# Patient Record
Sex: Male | Born: 1970 | Race: White | Hispanic: No | Marital: Married | Smoking: Current every day smoker
Health system: Southern US, Community
[De-identification: ages and names within clinical notes are randomized; demographics above are authoritative.]

## PROBLEM LIST (undated history)

## (undated) DIAGNOSIS — E785 Hyperlipidemia, unspecified: Secondary | ICD-10-CM

## (undated) DIAGNOSIS — F17200 Nicotine dependence, unspecified, uncomplicated: Secondary | ICD-10-CM

## (undated) DIAGNOSIS — E559 Vitamin D deficiency, unspecified: Secondary | ICD-10-CM

---

## 2002-10-25 ENCOUNTER — Emergency Department (HOSPITAL_COMMUNITY): Admission: EM | Admit: 2002-10-25 | Discharge: 2002-10-25 | Payer: Self-pay | Admitting: Emergency Medicine

## 2002-10-25 ENCOUNTER — Encounter: Payer: Self-pay | Admitting: Emergency Medicine

## 2018-03-08 ENCOUNTER — Observation Stay (HOSPITAL_COMMUNITY)
Admission: EM | Admit: 2018-03-08 | Discharge: 2018-03-08 | Disposition: A | Discharge status: Home / Self Care | Payer: BLUE CROSS/BLUE SHIELD | Attending: Cardiology | Admitting: Cardiology

## 2018-03-08 ENCOUNTER — Other Ambulatory Visit: Payer: Self-pay

## 2018-03-08 ENCOUNTER — Encounter (HOSPITAL_COMMUNITY)
Admission: EM | Disposition: A | Discharge status: Home / Self Care | Payer: Self-pay | Source: Home / Self Care | Attending: Emergency Medicine

## 2018-03-08 ENCOUNTER — Encounter (HOSPITAL_COMMUNITY): Payer: Self-pay | Admitting: Emergency Medicine

## 2018-03-08 ENCOUNTER — Emergency Department (HOSPITAL_COMMUNITY): Payer: BLUE CROSS/BLUE SHIELD

## 2018-03-08 DIAGNOSIS — R946 Abnormal results of thyroid function studies: Secondary | ICD-10-CM | POA: Diagnosis not present

## 2018-03-08 DIAGNOSIS — R079 Chest pain, unspecified: Secondary | ICD-10-CM | POA: Diagnosis not present

## 2018-03-08 DIAGNOSIS — R0789 Other chest pain: Secondary | ICD-10-CM | POA: Insufficient documentation

## 2018-03-08 DIAGNOSIS — I2510 Atherosclerotic heart disease of native coronary artery without angina pectoris: Secondary | ICD-10-CM | POA: Diagnosis not present

## 2018-03-08 DIAGNOSIS — E7800 Pure hypercholesterolemia, unspecified: Secondary | ICD-10-CM | POA: Diagnosis not present

## 2018-03-08 DIAGNOSIS — I214 Non-ST elevation (NSTEMI) myocardial infarction: Secondary | ICD-10-CM | POA: Diagnosis not present

## 2018-03-08 DIAGNOSIS — Z8249 Family history of ischemic heart disease and other diseases of the circulatory system: Secondary | ICD-10-CM | POA: Diagnosis not present

## 2018-03-08 DIAGNOSIS — Z720 Tobacco use: Secondary | ICD-10-CM

## 2018-03-08 DIAGNOSIS — R000 Tachycardia, unspecified: Secondary | ICD-10-CM | POA: Diagnosis not present

## 2018-03-08 DIAGNOSIS — E559 Vitamin D deficiency, unspecified: Secondary | ICD-10-CM | POA: Insufficient documentation

## 2018-03-08 DIAGNOSIS — F17210 Nicotine dependence, cigarettes, uncomplicated: Secondary | ICD-10-CM | POA: Diagnosis not present

## 2018-03-08 DIAGNOSIS — E039 Hypothyroidism, unspecified: Secondary | ICD-10-CM | POA: Diagnosis present

## 2018-03-08 DIAGNOSIS — E785 Hyperlipidemia, unspecified: Secondary | ICD-10-CM | POA: Diagnosis present

## 2018-03-08 HISTORY — DX: Nicotine dependence, unspecified, uncomplicated: F17.200

## 2018-03-08 HISTORY — DX: Vitamin D deficiency, unspecified: E55.9

## 2018-03-08 HISTORY — DX: Hyperlipidemia, unspecified: E78.5

## 2018-03-08 HISTORY — PX: LEFT HEART CATH AND CORONARY ANGIOGRAPHY: CATH118249

## 2018-03-08 LAB — BASIC METABOLIC PANEL
ANION GAP: 11 (ref 5–15)
BUN: 15 mg/dL (ref 6–20)
CALCIUM: 9.8 mg/dL (ref 8.9–10.3)
CO2: 25 mmol/L (ref 22–32)
CREATININE: 1.08 mg/dL (ref 0.61–1.24)
Chloride: 103 mmol/L (ref 101–111)
Glucose, Bld: 117 mg/dL — ABNORMAL HIGH (ref 65–99)
Potassium: 3.8 mmol/L (ref 3.5–5.1)
SODIUM: 139 mmol/L (ref 135–145)

## 2018-03-08 LAB — CBC
HCT: 44.4 % (ref 39.0–52.0)
HEMOGLOBIN: 14.6 g/dL (ref 13.0–17.0)
MCH: 29.8 pg (ref 26.0–34.0)
MCHC: 32.9 g/dL (ref 30.0–36.0)
MCV: 90.6 fL (ref 78.0–100.0)
PLATELETS: 220 10*3/uL (ref 150–400)
RBC: 4.9 MIL/uL (ref 4.22–5.81)
RDW: 13.9 % (ref 11.5–15.5)
WBC: 9.7 10*3/uL (ref 4.0–10.5)

## 2018-03-08 LAB — RAPID URINE DRUG SCREEN, HOSP PERFORMED
Amphetamines: NOT DETECTED
BARBITURATES: NOT DETECTED
Benzodiazepines: NOT DETECTED
Cocaine: NOT DETECTED
Opiates: NOT DETECTED
Tetrahydrocannabinol: POSITIVE — AB

## 2018-03-08 LAB — T4, FREE: Free T4: 0.48 ng/dL — ABNORMAL LOW (ref 0.82–1.77)

## 2018-03-08 LAB — URINALYSIS, ROUTINE W REFLEX MICROSCOPIC
BILIRUBIN URINE: NEGATIVE
Bacteria, UA: NONE SEEN
Glucose, UA: NEGATIVE mg/dL
KETONES UR: NEGATIVE mg/dL
LEUKOCYTES UA: NEGATIVE
Nitrite: NEGATIVE
PH: 6 (ref 5.0–8.0)
Protein, ur: NEGATIVE mg/dL
SPECIFIC GRAVITY, URINE: 1.017 (ref 1.005–1.030)

## 2018-03-08 LAB — TSH: TSH: 13.162 u[IU]/mL — ABNORMAL HIGH (ref 0.350–4.500)

## 2018-03-08 LAB — I-STAT TROPONIN, ED
TROPONIN I, POC: 0 ng/mL (ref 0.00–0.08)
Troponin i, poc: 1.03 ng/mL (ref 0.00–0.08)

## 2018-03-08 LAB — TROPONIN I: Troponin I: 1.47 ng/mL (ref <0.03)

## 2018-03-08 SURGERY — LEFT HEART CATH AND CORONARY ANGIOGRAPHY
Anesthesia: LOCAL

## 2018-03-08 MED ORDER — OMEPRAZOLE MAGNESIUM 20 MG PO TBEC: 20.0000 mg | DELAYED_RELEASE_TABLET | Freq: Every day | ORAL | Status: DC

## 2018-03-08 MED ORDER — ACETAMINOPHEN 325 MG PO TABS: 650.0000 mg | ORAL_TABLET | ORAL | Status: DC | PRN

## 2018-03-08 MED ORDER — HEPARIN SODIUM (PORCINE) 1000 UNIT/ML IJ SOLN
INTRAMUSCULAR | Status: AC
  Filled 2018-03-08: qty 1

## 2018-03-08 MED ORDER — HEPARIN (PORCINE) IN NACL 1000-0.9 UT/500ML-% IV SOLN
INTRAVENOUS | Status: AC
  Filled 2018-03-08: qty 1000

## 2018-03-08 MED ORDER — ALPRAZOLAM 0.25 MG PO TABS: 0.2500 mg | ORAL_TABLET | Freq: Two times a day (BID) | ORAL | Status: DC | PRN

## 2018-03-08 MED ORDER — MIDAZOLAM HCL 2 MG/2ML IJ SOLN
INTRAMUSCULAR | Status: DC | PRN
  Administered 2018-03-08: 2 mg via INTRAVENOUS

## 2018-03-08 MED ORDER — SODIUM CHLORIDE 0.9% FLUSH: 3.0000 mL | INTRAVENOUS | Status: DC | PRN

## 2018-03-08 MED ORDER — SODIUM CHLORIDE 0.9 % IV SOLN: 250.0000 mL | INTRAVENOUS | Status: DC | PRN

## 2018-03-08 MED ORDER — NITROGLYCERIN 0.4 MG SL SUBL: 0.4000 mg | SUBLINGUAL_TABLET | SUBLINGUAL | Status: DC | PRN

## 2018-03-08 MED ORDER — SODIUM CHLORIDE 0.9 % IV SOLN
INTRAVENOUS | Status: DC | PRN
  Administered 2018-03-08: 84 mL/h via INTRAVENOUS

## 2018-03-08 MED ORDER — METOPROLOL TARTRATE 25 MG PO TABS: 12.5000 mg | ORAL_TABLET | Freq: Two times a day (BID) | ORAL | 3 refills | Status: DC

## 2018-03-08 MED ORDER — FENTANYL CITRATE (PF) 100 MCG/2ML IJ SOLN
INTRAMUSCULAR | Status: DC | PRN
  Administered 2018-03-08: 25 ug via INTRAVENOUS

## 2018-03-08 MED ORDER — ZOLPIDEM TARTRATE 5 MG PO TABS: 5.0000 mg | ORAL_TABLET | Freq: Every evening | ORAL | Status: DC | PRN

## 2018-03-08 MED ORDER — ATORVASTATIN CALCIUM 20 MG PO TABS: 20.0000 mg | ORAL_TABLET | Freq: Every day | ORAL | Status: DC

## 2018-03-08 MED ORDER — HEPARIN SODIUM (PORCINE) 1000 UNIT/ML IJ SOLN
INTRAMUSCULAR | Status: DC | PRN
  Administered 2018-03-08: 4000 [IU] via INTRAVENOUS

## 2018-03-08 MED ORDER — ASPIRIN 81 MG PO CHEW: 324.0000 mg | CHEWABLE_TABLET | Freq: Once | ORAL | Status: DC

## 2018-03-08 MED ORDER — HEPARIN (PORCINE) IN NACL 2-0.9 UNITS/ML
INTRAMUSCULAR | Status: DC | PRN
  Administered 2018-03-08 (×2): 500 mL

## 2018-03-08 MED ORDER — LIDOCAINE HCL (PF) 1 % IJ SOLN
INTRAMUSCULAR | Status: AC
  Filled 2018-03-08: qty 30

## 2018-03-08 MED ORDER — FENTANYL CITRATE (PF) 100 MCG/2ML IJ SOLN
INTRAMUSCULAR | Status: AC
  Filled 2018-03-08: qty 2

## 2018-03-08 MED ORDER — MIDAZOLAM HCL 2 MG/2ML IJ SOLN
INTRAMUSCULAR | Status: AC
  Filled 2018-03-08: qty 2

## 2018-03-08 MED ORDER — VERAPAMIL HCL 2.5 MG/ML IV SOLN
INTRAVENOUS | Status: AC
  Filled 2018-03-08: qty 2

## 2018-03-08 MED ORDER — SODIUM CHLORIDE 0.9 % WEIGHT BASED INFUSION: 1.0000 mL/kg/h | INTRAVENOUS | Status: DC

## 2018-03-08 MED ORDER — IOHEXOL 350 MG/ML SOLN
INTRAVENOUS | Status: DC | PRN
  Administered 2018-03-08: 75 mL via INTRA_ARTERIAL

## 2018-03-08 MED ORDER — VERAPAMIL HCL 2.5 MG/ML IV SOLN
INTRAVENOUS | Status: DC | PRN
  Administered 2018-03-08: 10 mL via INTRA_ARTERIAL

## 2018-03-08 MED ORDER — ACETAMINOPHEN 325 MG PO TABS: 650.0000 mg | ORAL_TABLET | Freq: Four times a day (QID) | ORAL | Status: AC | PRN

## 2018-03-08 MED ORDER — METOPROLOL TARTRATE 25 MG PO TABS: 12.5000 mg | ORAL_TABLET | Freq: Two times a day (BID) | ORAL | Status: DC

## 2018-03-08 MED ORDER — ONDANSETRON HCL 4 MG/2ML IJ SOLN: 4.0000 mg | Freq: Four times a day (QID) | INTRAMUSCULAR | Status: DC | PRN

## 2018-03-08 MED ORDER — SODIUM CHLORIDE 0.9% FLUSH: 3.0000 mL | Freq: Two times a day (BID) | INTRAVENOUS | Status: DC

## 2018-03-08 MED ORDER — LIDOCAINE HCL (PF) 1 % IJ SOLN
INTRAMUSCULAR | Status: DC | PRN
  Administered 2018-03-08: 2 mL via SUBCUTANEOUS

## 2018-03-08 SURGICAL SUPPLY — 13 items
CATH INFINITI 5 FR JL3.5 (CATHETERS) ×2 IMPLANT
CATH INFINITI 5FR ANG PIGTAIL (CATHETERS) ×2 IMPLANT
CATH INFINITI JR4 5F (CATHETERS) ×2 IMPLANT
DEVICE RAD COMP TR BAND LRG (VASCULAR PRODUCTS) ×2 IMPLANT
GUIDEWIRE INQWIRE 1.5J.035X260 (WIRE) ×1 IMPLANT
INQWIRE 1.5J .035X260CM (WIRE) ×2
KIT HEART LEFT (KITS) ×2 IMPLANT
NEEDLE PERC ENTRY 21G 2.5CM (NEEDLE) ×2 IMPLANT
PACK CARDIAC CATHETERIZATION (CUSTOM PROCEDURE TRAY) ×2 IMPLANT
SHEATH RAIN RADIAL 21G 6FR (SHEATH) ×2 IMPLANT
SYR MEDRAD MARK V 150ML (SYRINGE) ×2 IMPLANT
TRANSDUCER W/STOPCOCK (MISCELLANEOUS) ×2 IMPLANT
TUBING CIL FLEX 10 FLL-RA (TUBING) ×2 IMPLANT

## 2018-03-08 NOTE — ED Triage Notes (Signed)
Pt in from home via [facility] with c/o central NR chest pressure this am. States he was sitting when pain started, also c/o sob. Denies n/v or dizziness. Pt took "1000mg  ASA" PTA and 2 NTG. Denies pain on arrival. ST 106, 18G LAC

## 2018-03-08 NOTE — Discharge Summary (Signed)
Discharge Summary    Patient ID: Edward Oliver,  MRN: [id number], DOB/AGE: 49-Apr-1972 49 y.o.  y.o.  Admit date: 03/08/2018 Discharge date: 03/08/2018  Primary Care Provider: Rehabilitation, [facility] & Primary Cardiologist: Dr Edward Oliver  Discharge Diagnoses    Principal Problem:   Chest pain with moderate risk of acute coronary syndrome Active Problems:   Tachycardia   Dyslipidemia   Tobacco abuse   Family history of coronary artery disease in father   Allergies No Known Allergies  Diagnostic Studies/Procedures    Cath 03/08/18 _____________   History of Present Illness     Chest pain  Hospital Course     Mr. Edward Oliver has treated HLD. He smokes a pack a day. His father had CABG in his 49. The pt was sitting at his desk today when he became "sweaty" and had SSCP, pressure. EMS was contacted and reported the patient had tachycardia "190". Unfortunately no strips are available. The pt says he was feeling better by the time he was being placed in the ambulance. He admits to a similar episode while walking in his yard a week ago. That episode was brief, "30 seconds".  His EKG shows NSR, his Troponin #2 is positive -1.0. He was taken to the cath lab where cath showed minor CAD and normal LVF. He will be discharged later today on low dose beta blocker and instructions to avoid caffeine. He also need to f/u his TSH with his PCP as he appears mildly hypothyroid.    Discharge Vitals Blood pressure 126/85, pulse (!) 0, temperature 98.4 F (36.9 C), resp. rate (!) 0, height 5\' 10"  (1.778 Oliver), weight 184 lb (83.5 kg), SpO2 (!) 0 %.  Filed Weights   03/08/18 1148  Weight: 184 lb (83.5 kg)    Labs & Radiologic Studies    CBC Recent Labs    03/08/18 0808  WBC 9.7  HGB 14.6  HCT 44.4  MCV 90.6  PLT 220   Basic Metabolic Panel Recent Labs    40/98/1104/26/19 0808  NA 139  K 3.8  CL 103  CO2 25  GLUCOSE 117*  BUN 15  CREATININE 1.08  CALCIUM 9.8    Liver Function Tests No results for input(s): AST, ALT, ALKPHOS, BILITOT, PROT, ALBUMIN in the last 72 hours. No results for input(s): LIPASE, AMYLASE in the last 72 hours. Cardiac Enzymes Recent Labs    03/08/18 1442  TROPONINI 1.47*   BNP Invalid input(s): POCBNP D-Dimer No results for input(s): DDIMER in the last 72 hours. Hemoglobin A1C No results for input(s): HGBA1C in the last 72 hours. Fasting Lipid Panel No results for input(s): CHOL, HDL, LDLCALC, TRIG, CHOLHDL, LDLDIRECT in the last 72 hours. Thyroid Function Tests Recent Labs    03/08/18 0948  TSH 13.162*   _____________  Dg Chest 2 View  Result Date: 03/08/2018 CLINICAL DATA:  Chest pain and dizziness this morning EXAM: CHEST - 2 VIEW COMPARISON:  07/07/2016 FINDINGS: Upper normal heart size. Mediastinal contours and pulmonary vascularity normal. Lungs clear. No pleural effusion or pneumothorax. Bones unremarkable. IMPRESSION: No acute abnormalities. Electronically Signed   By: Edward Oliver  Edward Oliver Oliver.D.   On: 03/08/2018 08:54   Disposition   Pt is being discharged home today in good condition.  Follow-up Plans & Appointments    Follow-up Information    Edward Oliver, Edward M, MD Follow up.   Specialty:  Cardiology Why:  office will contact to you to see Dr Edward Oliver's APP in 1-2 weeks  Contact information: [address] [place] [address] [phone]        Rehabilitation, [facility] & Follow up.   Specialty:  Physical Medicine and Rehabilitation Why:  Call PCP's office Monday to follow up on thyroid tests           Discharge Medications   Allergies as of 03/08/2018   No Known Allergies     Medication List    TAKE these medications   acetaminophen 325 MG tablet Commonly known as:  TYLENOL Take 2 tablets (650 mg total) by mouth every 6 (six) hours as needed for mild pain or headache.   atorvastatin 20 MG tablet Commonly known as:  LIPITOR Take 20 mg by  mouth daily.   metoprolol tartrate 25 MG tablet Commonly known as:  LOPRESSOR Take 0.5 tablets (12.5 mg total) by mouth 2 (two) times daily.   PRILOSEC OTC 20 MG tablet Generic drug:  omeprazole Take 20 mg by mouth daily.   vitamin C 1000 MG tablet Take 500 mg by mouth daily.   Vitamin D3 2000 units capsule Take 2,000 Units by mouth daily.           Outstanding Labs/Studies     Duration of Discharge Encounter   Greater than 30 minutes including physician time.  Signed, [address] [place] 03/08/2018, 4:51 PM

## 2018-03-08 NOTE — Interval H&P Note (Signed)
History and Physical Interval Note:  03/08/2018 4:05 PM  Edward Oliver  has presented today for surgery, with the diagnosis of cp nstemi  The various methods of treatment have been discussed with the patient and family. After consideration of risks, benefits and other options for treatment, the patient has consented to  Procedure(s): LEFT HEART CATH AND CORONARY ANGIOGRAPHY (N/A) as a surgical intervention .  The patient's history has been reviewed, patient examined, no change in status, stable for surgery.  I have reviewed the patient's chart and labs.  Questions were answered to the patient's satisfaction.   Cath Lab Visit (complete for each Cath Lab visit)  Clinical Evaluation Leading to the Procedure:   ACS: Yes.    Non-ACS:    Anginal Classification: CCS III  Anti-ischemic medical therapy: No Therapy  Non-Invasive Test Results: No non-invasive testing performed  Prior CABG: No previous CABG        Theron Aristaeter [facility] CampusJordanMD,FACC 03/08/2018 4:05 PM]

## 2018-03-08 NOTE — Discharge Instructions (Signed)
Supraventricular Tachycardia, Adult °Supraventricular tachycardia (SVT) is a kind of abnormal heartbeat. It makes your heart beat very fast and then beat at a normal speed. °A normal heart beats 60-100 times a minute. This condition can make your heart beat more than 150 times a minute. Times of having a fast heartbeat (episodes) can be scary, but they are usually not dangerous. They can lead to problems if: °· They happen often. °· They last a long time. °Symptoms of this condition include: °· A pounding heart. °· A feeling that your heart is skipping beats (palpitations). °· Weakness. °· Trouble getting enough air (shortness of breath). °· Pain or tightness in your chest. °· Feeling like you are going to pass out (light-headedness). °· Feeling worried or nervous (anxiety). °· Dizziness. °· Sweating. °· Feeling sick to your stomach (nausea). °· Passing out (fainting). °· Tiredness. °Sometimes, there are no symptoms. °Follow these instructions at home: °Stress  °· Avoid things that make you feel stressed. °· Find out what helps you feel less stressed. Try: °¨ Doing a relaxing activity, like yoga, meditation, or being out in nature. °¨ Listening to relaxing music. °¨ Doing relaxation techniques, like deep breathing. °¨ Taking steps to be healthy. These include getting lots of sleep, exercising, and eating a balanced diet. °¨ Talking with a mental health doctor. °Sleep  °· Try to get at least 7 hours of sleep each night. °Tobacco and nicotine  °· Do not use anything that has nicotine or tobacco, such as cigarettes and e-cigarettes. If you need help quitting, ask your doctor. °Alcohol  °· If alcohol gives you a fast heartbeat, do not drink alcohol. °· If alcohol does not seem to give you a fast heartbeat, limit your alcohol. For nonpregnant women, this means no more than 1 drink a day. For men, this means no more than 2 drinks a day. "One drink" means one of these: °¨ 12 oz of beer. °¨ 5 oz of wine. °¨ 1½ oz of hard  liquor. °Caffeine  °· If caffeine gives you a fast heartbeat, do not eat, drink, or use anything with caffeine in it. °· If caffeine does not seem to give you a fast heartbeat, limit how much caffeine you eat, drink, or use. °Stimulant drugs  °· Do not use stimulant drugs. These are drugs like cocaine or methamphetamine. If you need help quitting, ask your doctor. °General instructions  °· Stay at a healthy weight. °· Exercise regularly. Ask your doctor to suggest some good activities for you. Try one of these options: °¨ 150 minutes a week of gentle exercise, like walking or yoga. °¨ 75 minutes a week of exercise that is very active, like running or swimming. °¨ A combination of gentle exercise and very active exercise. °· Do home treatments to slow down your heartbeat as told by your doctor. °· Take over-the-counter and prescription medicines only as told by your doctor. °Contact a doctor if: °· You have a fast heartbeat more often. °· Times of having a fast heartbeat last longer than before. °· Your home treatments to slow down your heartbeat do not help. °· You have new symptoms. °Get help right away if: °· You have chest pain. °· Your symptoms get worse. °· You have trouble breathing. °· Your heart beats very fast for more than 20 minutes. °· You pass out (faint). °These symptoms may be an emergency. Do not wait to see if the symptoms will go away. Get medical help right away. Call your   [place] Do not drive yourself to the hospital. This information is not intended to replace advice given to you by your health care provider. Make sure you discuss any questions you have with your health care provider. Document Released: 10/30/2005 Document Revised: 07/06/2016 Document Reviewed: 07/06/2016 Elsevier Interactive Patient Education  2017 Elsevier Inc[employer]Radial Site Care Refer to this sheet in the next few weeks. These instructions provide you with information about caring for  yourself after your procedure. Your health care provider may also give you more specific instructions. Your treatment has been planned according to current medical practices, but problems sometimes occur. Call your health care provider if you have any problems or questions after your procedure. What can I expect after the procedure? After your procedure, it is typical to have the following:  Bruising at the radial site that usually fades within 1-2 weeks.  Blood collecting in the tissue (hematoma) that may be painful to the touch. It should usually decrease in size and tenderness within 1-2 weeks.  Follow these instructions at home:  Take medicines only as directed by your health care provider.  You may shower 24-48 hours after the procedure or as directed by your health care provider. Remove the bandage (dressing) and gently wash the site with plain soap and water. Pat the area dry with a clean towel. Do not rub the site, because this may cause bleeding.  Do not take baths, swim, or use a hot tub until your health care provider approves.  Check your insertion site every day for redness, swelling, or drainage.  Do not apply powder or lotion to the site.  Do not flex or bend the affected arm for 24 hours or as directed by your health care provider.  Do not push or pull heavy objects with the affected arm for 24 hours or as directed by your health care provider.  Do not lift over 10 lb (4.5 kg) for 5 days after your procedure or as directed by your health care provider.  Ask your health care provider when it is okay to: ? Return to work or school. ? Resume usual physical activities or sports. ? Resume sexual activity.  Do not drive home if you are discharged the same day as the procedure. Have someone else drive you.  You may drive 24 hours after the procedure unless otherwise instructed by your health care provider.  Do not operate machinery or power tools for 24 hours after the  procedure.  If your procedure was done as an outpatient procedure, which means that you went home the same day as your procedure, a responsible adult should be with you for the first 24 hours after you arrive home.  Keep all follow-up visits as directed by your health care provider. This is important. Contact a health care provider if:  You have a fever.  You have chills.  You have increased bleeding from the radial site. Hold pressure on the site. Get help right away if:  You have unusual pain at the radial site.  You have redness, warmth, or swelling at the radial site.  You have drainage (other than a small amount of blood on the dressing) from the radial site.  The radial site is bleeding, and the bleeding does not stop after 30 minutes of holding steady pressure on the site.  Your arm or hand becomes pale, cool, tingly, or numb. This information is not intended to replace advice given to you by your health care provider. Make  sure you discuss any questions you have with your health care provider. Document Released: 12/02/2010 Document Revised: 04/06/2016 Document Reviewed: 05/18/2014 Elsevier Interactive Patient Education  2018 [employer]

## 2018-03-08 NOTE — H&P (Signed)
Cardiology Admission History and Physical:   Patient ID: Edward Oliver; MRN: [id number]; DOB: 1971-08-24   Admission date: 03/08/2018  Primary Care Provider: Rehabilitation, [facility] Physical [facility] & Primary Cardiologist: [place]  Chief Complaint:  Chest pain  Patient Profile:   Edward Oliver is a 47 y.o. male with a history of HLD, smoking, and a family history of early CAD, presented to the ED via EMS with complaints of chest pain.  History of Present Illness:   Edward Oliver has treated HLD. He smokes a pack a day. His father had CABG in his 14's. The pt was sitting at his desk today when he became "sweaty" and had SSCP, pressure. EMS was contacted and reported the patient had tachycardia "190". Unfortunately no strips are available. The pt says he was feeling better by the time he was being placed in the ambulance. He admits to a similar episode while walking in his yard a week ago. That episode was brief, "30 seconds".  His EKG shows NSR, his Troponin #2 is positive -1.0.   Past Medical History:  Diagnosis Date  . Hyperlipemia   . Smoker   . Vitamin D deficiency     Medications Prior to Admission: Prior to Admission medications   Medication Sig Start Date End Date Taking? Authorizing Provider  Ascorbic Acid (VITAMIN C) 1000 MG tablet Take 500 mg by mouth daily.   Yes [provider]  atorvastatin (LIPITOR) 20 MG tablet Take 20 mg by mouth daily. 05/17/17 05/17/18 Yes [provider]  Cholecalciferol (VITAMIN D3) 2000 units capsule Take 2,000 Units by mouth daily.   Yes [provider]  omeprazole (PRILOSEC OTC) 20 MG tablet Take 20 mg by mouth daily.   Yes [provider]     Allergies:   No Known Allergies  Social History:   Social History   Socioeconomic History  . Marital status: Married    Spouse name: Not on file  . Number of children: Not on file  . Years of education: Not on file  . Highest education  level: Not on file  Occupational History  . Not on file  Social Needs  . Financial resource strain: Not on file  . Food insecurity:    Worry: Not on file    Inability: Not on file  . Transportation needs:    Medical: Not on file    Non-medical: Not on file  Tobacco Use  . Smoking status: Current Every Day Smoker    Packs/day: 1.00    Types: Cigarettes  Substance and Sexual Activity  . Alcohol use: Not on file  . Drug use: Not on file  . Sexual activity: Not on file  Lifestyle  . Physical activity:    Days per week: Not on file    Minutes per session: Not on file  . Stress: Not on file  Relationships  . Social connections:    Talks on phone: Not on file    Gets together: Not on file    Attends religious service: Not on file    Active member of club or organization: Not on file    Attends meetings of clubs or organizations: Not on file    Relationship status: Not on file  . Intimate partner violence:    Fear of current or ex partner: Not on file    Emotionally abused: Not on file    Physically abused: Not on file    Forced sexual activity: Not on  file  Other Topics Concern  . Not on file  Social History Narrative  . Not on file    Family History:   The patient's family history includes CAD in his father.    ROS:  Please see the history of present illness.  All other ROS reviewed and negative.     Physical Exam/Data:   Vitals:   03/08/18 1300 03/08/18 1315 03/08/18 1330 03/08/18 1345  BP: 117/86 123/84 127/84 (!) 129/92  Pulse: (!) 59 (!) 59 63 64  Resp: 18 (!) 21 15 18   Temp:      SpO2: 95% 95% 95% 94%  Weight:      Height:       No intake or output data in the 24 hours ending 03/08/18 1359 Filed Weights   03/08/18 1148  Weight: 184 lb (83.5 kg)   Body mass index is 26.4 kg/m.  General:  Well nourished, well developed, in no acute distress HEENT: normal Lymph: no adenopathy Neck: no JVD, no bruit Endocrine:  No thryomegaly Vascular: No carotid  bruits; FA pulses 2+ bilaterally without bruits  Cardiac:  normal S1, S2; RRR; no murmur  Lungs:  clear to auscultation bilaterally, no wheezing, rhonchi or rales  Abd: soft, nontender, no hepatomegaly  Ext: no edema, distal pulses 3+ Musculoskeletal:  No deformities, BUE and BLE strength normal and equal Skin: warm and dry  Neuro:  CNs 2-12 intact, no focal abnormalities noted Psych:  Normal affect    EKG:  The ECG that was done and was personally reviewed and demonstrates NSR  Laboratory Data:  Chemistry Recent Labs  Lab 03/08/18 0808  NA 139  K 3.8  CL 103  CO2 25  GLUCOSE 117*  BUN 15  CREATININE 1.08  CALCIUM 9.8  GFRNONAA >60  GFRAA >60  ANIONGAP 11    No results for input(s): PROT, ALBUMIN, AST, ALT, ALKPHOS, BILITOT in the last 168 hours. Hematology Recent Labs  Lab 03/08/18 0808  WBC 9.7  RBC 4.90  HGB 14.6  HCT 44.4  MCV 90.6  MCH 29.8  MCHC 32.9  RDW 13.9  PLT 220   Cardiac EnzymesNo results for input(s): TROPONINI in the last 168 hours.  Recent Labs  Lab 03/08/18 0816 03/08/18 1153  TROPIPOC 0.00 1.03*    BNPNo results for input(s): BNP, PROBNP in the last 168 hours.  DDimer No results for input(s): DDIMER in the last 168 hours.  Radiology/Studies:  Dg Chest 2 View  Result Date: 03/08/2018 CLINICAL DATA:  Chest pain and dizziness this morning EXAM: CHEST - 2 VIEW COMPARISON:  07/07/2016 FINDINGS: Upper normal heart size. Mediastinal contours and pulmonary vascularity normal. Lungs clear. No pleural effusion or pneumothorax. Bones unremarkable. IMPRESSION: No acute abnormalities. Electronically Signed   By: Ulyses Southward M.D.   On: 03/08/2018 08:54    Assessment and Plan:   Chest pain- moderate risk of acute coronary syndrome, possible NSTEMI  PSVT by history- no documentation  Dyslipidemia he had an LDL of > 200 at one point  Smoker- 1PPD  FM Hx of CAD- F-CABG in his 40's  Elevated TSH- 13.1- no history of hypothyroidism and no prior  TSH levels found ( this could explain his dyslipidemia).   Plan: Pt seen by Dr [place] and myself, plan diagnostic cath today. The patient understands that risks included but are not limited to stroke (1 in 1000), death (1 in 1000), kidney failure [usually temporary] (1 in 500), bleeding (1 in 200), allergic reaction [possibly serious] (  1 in 200).  The patient understands and agrees to proceed.  Add ASA and low dose beta blocker. Check repeat TSH with free T4.   Severity of Illness: The appropriate patient status for this patient is OBSERVATION. Observation status is judged to be reasonable and necessary in order to provide the required intensity of service to ensure the patient's safety. The patient's presenting symptoms, physical exam findings, and initial radiographic and laboratory data in the context of their medical condition is felt to place them at decreased risk for further clinical deterioration. Furthermore, it is anticipated that the patient will be medically stable for discharge from the hospital within 2 midnights of admission. The following factors support the patient status of observation.   " The patient's presenting symptoms include chest pain. " The physical exam findings include normal. " The initial radiographic and laboratory data are elevated Troponin.     For questions or updates, please contact [facility] Please consult [email or web address] for contact info under Cardiology/STEMI.    Jolene ProvostSigned, Melanny Wire, PA-C  03/08/2018 1:59 PM

## 2018-03-08 NOTE — ED Provider Notes (Signed)
[facility] EMERGENCY DEPARTMENT Provider Note   CSN: [id number] Arrival date & time: 03/08/18  0801     History   Chief Complaint Chief Complaint  Patient presents with  . Chest Pain    HPI Edward Oliver is a 47 y.o. male.  Pt presents to the ED today with cp and sob.  Pt said he had just gotten to work when he felt like something was squeezing his chest.  He felt sob and felt clammy.   The pt said he measured his HR and it was 190.  He said when EMS arrived, it was down to 170.  No strips from EMS with this extreme tachycardia.  The pt said this happened to him last week for about 30 seconds.  He denies any otc meds.  He said he drinks 1 cup of coffee per day then drinks 5 mountain dews per day.  He also smokes.  He took 1000 mg ASA then EMS gave him 2 nitro.  He has no cp now and feels normal.       Past Medical History:  Diagnosis Date  . Hyperlipemia   . Smoker   . Vitamin D deficiency     Patient Active Problem List   Diagnosis Date Noted  . Chest pain with moderate risk of acute coronary syndrome 03/08/2018  . Tachycardia 03/08/2018  . Dyslipidemia 03/08/2018  . Smoker 03/08/2018  . Family history of coronary artery disease in father 03/08/2018        Home Medications    Prior to Admission medications   Medication Sig Start Date End Date Taking? Authorizing Provider  Ascorbic Acid (VITAMIN C) 1000 MG tablet Take 500 mg by mouth daily.   Yes [provider]  atorvastatin (LIPITOR) 20 MG tablet Take 20 mg by mouth daily. 05/17/17 05/17/18 Yes [provider]  Cholecalciferol (VITAMIN D3) 2000 units capsule Take 2,000 Units by mouth daily.   Yes [provider]  omeprazole (PRILOSEC OTC) 20 MG tablet Take 20 mg by mouth daily.   Yes [provider]    Family History Family History  Problem Relation Age of Onset  . CAD Father        CABG in 80's    Social History Social History   Tobacco Use  .  Smoking status: Current Every Day Smoker    Packs/day: 1.00    Types: Cigarettes  Substance Use Topics  . Alcohol use: Not on file  . Drug use: Not on file     Allergies   Patient has no known allergies.   Review of Systems Review of Systems  Respiratory: Positive for shortness of breath.   Cardiovascular: Positive for chest pain and palpitations.  All other systems reviewed and are negative.    Physical Exam Updated Vital Signs BP 126/80   Pulse 66   Temp 98.4 F (36.9 C)   Resp 11   Ht 5\' 10"  (1.778 m)   Wt 83.5 kg (184 lb)   SpO2 97%   BMI 26.40 kg/m   Physical Exam  Constitutional: He is oriented to person, place, and time. He appears well-developed and well-nourished.  HENT:  Head: Normocephalic and atraumatic.  Eyes: Pupils are equal, round, and reactive to light. EOM are normal.  Neck: Normal range of motion. Neck supple.  Cardiovascular: Normal rate, regular rhythm, intact distal pulses and normal pulses.  Pulmonary/Chest: Effort normal and breath sounds normal.  Abdominal: Soft. Bowel sounds are normal.  Musculoskeletal: Normal range of motion.       Right lower leg: Normal.       Left lower leg: Normal.  Neurological: He is alert and oriented to person, place, and time.  Skin: Skin is warm. Capillary refill takes less than 2 seconds.  Psychiatric: He has a normal mood and affect. His behavior is normal.  Nursing note and vitals reviewed.    ED Treatments / Results  Labs (all labs ordered are listed, but only abnormal results are displayed) Labs Reviewed  BASIC METABOLIC PANEL - Abnormal; Notable for the following components:      Result Value   Glucose, Bld 117 (*)    All other components within normal limits  RAPID URINE DRUG SCREEN, HOSP PERFORMED - Abnormal; Notable for the following components:   Tetrahydrocannabinol POSITIVE (*)    All other components within normal limits  URINALYSIS, ROUTINE W REFLEX MICROSCOPIC - Abnormal; Notable for  the following components:   Hgb urine dipstick SMALL (*)    All other components within normal limits  TSH - Abnormal; Notable for the following components:   TSH 13.162 (*)    All other components within normal limits  I-STAT TROPONIN, ED - Abnormal; Notable for the following components:   Troponin i, poc 1.03 (*)    All other components within normal limits  CBC  T4, FREE  TROPONIN I  TROPONIN I  TROPONIN I  I-STAT TROPONIN, ED    EKG EKG Interpretation  Date/Time:  Friday March 08 2018 08:05:13 EDT Ventricular Rate:  79 PR Interval:    QRS Duration: 98 QT Interval:  384 QTC Calculation: 441 R Axis:   84 Text Interpretation:  Sinus rhythm Baseline wander in lead(s) III No old tracing to compare Confirmed by Jacalyn LefevreHaviland, Hanny Elsberry [phone]) on 03/08/2018 8:40:53 AM   Radiology Dg Chest 2 View  Result Date: 03/08/2018 CLINICAL DATA:  Chest pain and dizziness this morning EXAM: CHEST - 2 VIEW COMPARISON:  07/07/2016 FINDINGS: Upper normal heart size. Mediastinal contours and pulmonary vascularity normal. Lungs clear. No pleural effusion or pneumothorax. Bones unremarkable. IMPRESSION: No acute abnormalities. Electronically Signed   By: Ulyses SouthwardMark  Boles M.D.   On: 03/08/2018 08:54    Procedures Procedures (including critical care time)  Medications Ordered in ED Medications  aspirin chewable tablet 324 mg (has no administration in time range)  metoprolol tartrate (LOPRESSOR) tablet 12.5 mg (has no administration in time range)  acetaminophen (TYLENOL) tablet 650 mg (has no administration in time range)  ondansetron (ZOFRAN) injection 4 mg (has no administration in time range)  zolpidem (AMBIEN) tablet 5 mg (has no administration in time range)  ALPRAZolam (XANAX) tablet 0.25 mg (has no administration in time range)  nitroGLYCERIN (NITROSTAT) SL tablet 0.4 mg (has no administration in time range)  atorvastatin (LIPITOR) tablet 20 mg (has no administration in time range)  omeprazole  (PRILOSEC OTC) EC tablet 20 mg (has no administration in time range)     Initial Impression / Assessment and Plan / ED Course  I have reviewed the triage vital signs and the nursing notes.  Pertinent labs & imaging results that were available during my care of the patient were reviewed by me and considered in my medical decision making (see chart for details).     Pt's 1st troponin was negative, but 3 hr troponin was positive.  Pt d/w cardiology who will see pt.  No cp now.  Pt d/w cardiology who will admit for observation and plan a  diagnostic cath.  Final Clinical Impressions(s) / ED Diagnoses   Final diagnoses:  NSTEMI (non-ST elevated myocardial infarction) (HCC)  Hypothyroidism, unspecified type  Tobacco abuse    ED Discharge Orders    None       Jacalyn Lefevre, MD 03/08/18 1410

## 2018-03-09 LAB — HIV ANTIBODY (ROUTINE TESTING W REFLEX): HIV Screen 4th Generation wRfx: NONREACTIVE

## 2018-03-11 ENCOUNTER — Telehealth: Payer: Self-pay | Admitting: *Deleted

## 2018-03-11 ENCOUNTER — Encounter (HOSPITAL_COMMUNITY): Payer: Self-pay | Admitting: Cardiology

## 2018-03-11 MED FILL — Heparin Sod (Porcine)-NaCl IV Soln 1000 Unit/500ML-0.9%: INTRAVENOUS | Qty: 1000 | Status: AC

## 2018-03-11 NOTE — Telephone Encounter (Signed)
Left message for patient to call and schedule 2 week post hospital visit with Dr. Swaziland or [name], [place]

## 2018-03-27 ENCOUNTER — Encounter: Payer: Self-pay | Admitting: Cardiology

## 2018-03-27 ENCOUNTER — Ambulatory Visit: Payer: BLUE CROSS/BLUE SHIELD | Admitting: Cardiology

## 2018-03-27 DIAGNOSIS — I2510 Atherosclerotic heart disease of native coronary artery without angina pectoris: Secondary | ICD-10-CM | POA: Diagnosis not present

## 2018-03-27 DIAGNOSIS — R000 Tachycardia, unspecified: Secondary | ICD-10-CM

## 2018-03-27 DIAGNOSIS — Z720 Tobacco use: Secondary | ICD-10-CM

## 2018-03-27 DIAGNOSIS — E785 Hyperlipidemia, unspecified: Secondary | ICD-10-CM | POA: Diagnosis not present

## 2018-03-27 DIAGNOSIS — R7989 Other specified abnormal findings of blood chemistry: Secondary | ICD-10-CM

## 2018-03-27 DIAGNOSIS — Z8249 Family history of ischemic heart disease and other diseases of the circulatory system: Secondary | ICD-10-CM

## 2018-03-27 NOTE — Assessment & Plan Note (Signed)
TSH was 13- he knows to contact his PCP

## 2018-03-27 NOTE — Assessment & Plan Note (Signed)
Minor CAD at cath April 2019- 20-25% LAD 

## 2018-03-27 NOTE — Patient Instructions (Signed)
Medication Instructions:  Continue with current medications   Follow-Up: Your physician wants you to follow-up in: 6 months with Dr. Swaziland. You will receive a reminder letter in the mail two months in advance. If you don't receive a letter, please call our office to schedule the follow-up appointment.   Any Other Special Instructions Will Be Listed Below (If Applicable).     If you need a refill on your cardiac medications before your next appointment, please call your pharmacy.

## 2018-03-27 NOTE — Assessment & Plan Note (Signed)
CABG in late 40's 

## 2018-03-27 NOTE — Assessment & Plan Note (Signed)
1 PPD 

## 2018-03-27 NOTE — Progress Notes (Signed)
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 The patient smokes a pack a day. His father had CABG in his 2's. The pt was sitting at his desk 03/08/18 when he became "sweaty" and had SSCP described as "pressure". EMS was contacted and reported the patient had tachycardia "190". Unfortunately no strips are available. His EKG showed NSR, his Troponin was positive -and peaked at 1.47. He was taken to the cath lab where cath showed minor CAD and normal LVF. He was discharged on low dose beta blocker and instructions to avoid caffeine. He also needs to f/u his TSH with his PCP as he appears mildly hypothyroid with a TSH of  13.1.   Since discharge he has had one episode of tachycardia that lasted on 15 seconds or less. He is tolerating the low dose Lopressor. His main complaint is stress related to work.     Current Outpatient Medications  Medication Sig Dispense Refill  . acetaminophen (TYLENOL) 325 MG tablet Take 2 tablets (650 mg total) by mouth every 6 (six) hours as needed for mild pain or headache.    . Ascorbic Acid (VITAMIN C) 1000 MG tablet Take 500 mg by mouth every other day.     Marland Kitchen atorvastatin (LIPITOR) 20 MG tablet Take 20 mg by mouth daily.    . Cholecalciferol (VITAMIN D3) 2000 units capsule Take 2,000 Units by mouth daily.    . metoprolol tartrate (LOPRESSOR) 25 MG tablet Take 0.5 tablets (12.5 mg total) by mouth 2 (two) times daily. 90 tablet 3  . Multiple Vitamins-Minerals (ECHINACEA ACZ PO) Take 1 tablet by mouth every other day.    Marland Kitchen omeprazole (PRILOSEC OTC) 20 MG tablet Take 20 mg by mouth daily.      No current facility-administered medications for this visit.     No Known Allergies  Past Medical History:  Diagnosis Date  . Hyperlipemia   . Smoker   . Vitamin D deficiency     Social History   Socioeconomic History  . Marital status: Married    Spouse name: Not on file  . Number of children: Not on file  . Years of education: Not on file  . Highest education level: Not on file  Occupational History  . Not on file  Social Needs  . Financial resource strain: Not on file  . Food insecurity:    Worry: Not on file    Inability: Not on file  . Transportation needs:    Medical: Not on file    Non-medical: Not on file  Tobacco Use  . Smoking status: Current Every Day Smoker    Packs/day: 1.00    Types: Cigarettes  Substance and Sexual Activity  . Alcohol use: Not on file  . Drug use: Not on file  . Sexual activity: Not on file  Lifestyle  . Physical activity:    Days per week: Not on file    Minutes per session: Not on file  . Stress: Not on file  Relationships  . Social connections:    Talks on phone: Not on file    Gets together: Not on  file    Attends religious service: Not on file    Active member of club or organization: Not on file    Attends meetings of clubs or organizations: Not on file    Relationship status: Not on file  . Intimate partner violence:    Fear of current or ex partner: Not on file    Emotionally abused: Not on file    Physically abused: Not on file    Forced sexual activity: Not on file  Other Topics Concern  . Not on file  Social History Narrative  . Not on file     Family History  Problem Relation Age of Onset  . CAD Father        CABG in 40's     Review of Systems: General: negative for chills, fever, night sweats or weight changes.  Cardiovascular: negative for chest pain, dyspnea on exertion, edema, orthopnea, palpitations, paroxysmal nocturnal dyspnea or shortness of breath Dermatological: negative for  rash Respiratory: negative for cough or wheezing Urologic: negative for hematuria Abdominal: negative for nausea, vomiting, diarrhea, bright red blood per rectum, melena, or hematemesis Neurologic: negative for visual changes, syncope, or dizziness All other systems reviewed and are otherwise negative except as noted above.    Blood pressure 128/84, pulse 68, height  (1.778 m), weight 191 lb (86.6 kg), SpO2 97 %.  General appearance: alert, cooperative and no distress Neck: no carotid bruit and no JVD Lungs: clear to auscultation bilaterally Abdomen: soft, non-tender; bowel sounds normal; no masses,  no organomegaly Extremities: extremities normal, atraumatic, no cyanosis or edema Skin: Skin color, texture, turgor normal. No rashes or lesions Neurologic: Grossly normal  EKG 03/08/18- NSR- no Delta wave  ASSESSMENT AND PLAN:   Tachycardia Reported but not documented- sounds like he had PSVT  Dyslipidemia On statin Rx-followed by PCP  CAD (coronary artery disease) Minor CAD at cath April 2019- 20-25% LAD  Tobacco abuse 1 PPD  Family history of coronary artery disease in father CABG in late 67's  TSH elevation TSH was 24- he knows to contact his PCP   PLAN  Continue low dose Lopressor. I explained valsalva maneuver he can use for breakthrough tachycardia. He knows he can also increase his Lopressor to 25 mg BID if he needs to for a few days. If he has recurrent breakthrough tachycardia on beta blocker we'll need to get a monitor and consider EP evaluation. He also needs continued risk factor modification for CAD. F/U with Dr Swaziland in 6 months. F/U with PCP as well.  [name] PA-C 03/27/2018 11:36 AM

## 2018-03-27 NOTE — Assessment & Plan Note (Signed)
On statin Rx- followed by PCP 

## 2018-03-27 NOTE — Assessment & Plan Note (Signed)
Reported but not documented- sounds like he had PSVT

## 2018-10-17 ENCOUNTER — Ambulatory Visit: Payer: BLUE CROSS/BLUE SHIELD | Admitting: Cardiology

## 2018-10-17 ENCOUNTER — Encounter: Payer: Self-pay | Admitting: Cardiology

## 2018-10-17 ENCOUNTER — Telehealth: Payer: Self-pay | Admitting: Cardiology

## 2018-10-17 VITALS — BP 138/90 | HR 75 | Ht 70.0 in | Wt 175.6 lb

## 2018-10-17 DIAGNOSIS — I2510 Atherosclerotic heart disease of native coronary artery without angina pectoris: Secondary | ICD-10-CM

## 2018-10-17 DIAGNOSIS — E785 Hyperlipidemia, unspecified: Secondary | ICD-10-CM | POA: Diagnosis not present

## 2018-10-17 DIAGNOSIS — Z720 Tobacco use: Secondary | ICD-10-CM

## 2018-10-17 DIAGNOSIS — R000 Tachycardia, unspecified: Secondary | ICD-10-CM

## 2018-10-17 DIAGNOSIS — Z8249 Family history of ischemic heart disease and other diseases of the circulatory system: Secondary | ICD-10-CM | POA: Diagnosis not present

## 2018-10-17 MED ORDER — NEBIVOLOL HCL 5 MG PO TABS: 5.0000 mg | ORAL_TABLET | Freq: Every day | ORAL | 6 refills | Status: DC

## 2018-10-17 NOTE — Assessment & Plan Note (Signed)
On statin Rx-LDL 109 July 2018

## 2018-10-17 NOTE — Progress Notes (Signed)
10/17/2018 Edward Oliver   [birth year]  [id number]  Primary Physician Rehabilitation, [facility] Physical Medicine & Primary Cardiologist: Dr Swaziland  HPI: Edward Oliver is a pleasant 47 year old male who we saw in April 2019 when he presented to the emergency room with chest pain and shortness of breath after an episode of tachycardia at work.  Patient had a troponin elevation of 1.47.  When he came to the emergency room he was in sinus rhythm.  He was taken to the Cath Lab where he had minor coronary disease and normal LV function.  He was discharged on low-dose beta-blocker.  I saw him in follow-up in May 2019.  He was referred back to his primary care provider for follow-up of an abnormal TSH which he did.  He did not ever follow-up with Dr. Swaziland.  He is in the office today with complaints of 2 types of tachycardia.  He describes the lesser version as 1 or 2 dropped beats.  But also once a month he may have an episode of sustained tachycardia where he becomes sweaty and nauseated and dizzy.  This may last for 30 minutes or more.  Usually after these episodes he feels washed out for 2 days or so.  He just had an episode of sustained tachycardia that lasted 20 minutes 3 days ago.  This eventually resolved after he had nausea and vomiting.  He admits in general he feels tired and he attributes this to the metoprolol.    Current Outpatient Medications  Medication Sig Dispense Refill  . acetaminophen (TYLENOL) 325 MG tablet Take 2 tablets (650 mg total) by mouth every 6 (six) hours as needed for mild pain or headache.    . Ascorbic Acid (VITAMIN C) 1000 MG tablet Take 500 mg by mouth every other day.     . Cholecalciferol (VITAMIN D3) 2000 units capsule Take 2,000 Units by mouth daily.    Marland Kitchen levothyroxine (SYNTHROID, LEVOTHROID) 25 MCG tablet Take 1 tablet by mouth daily.  0  . Multiple Vitamins-Minerals (ECHINACEA ACZ PO) Take 1 tablet by mouth every other day.    Marland Kitchen omeprazole  (PRILOSEC OTC) 20 MG tablet Take 20 mg by mouth daily.    . ondansetron (ZOFRAN) 4 MG tablet Take 1 tablet by mouth every 8 (eight) hours as needed for nausea/vomiting.    . sertraline (ZOLOFT) 25 MG tablet Take 1 tablet by mouth daily.    Marland Kitchen atorvastatin (LIPITOR) 20 MG tablet Take 20 mg by mouth daily.    . nebivolol (BYSTOLIC) 5 MG tablet Take 1 tablet (5 mg total) by mouth daily. 30 tablet 6   No current facility-administered medications for this visit.     No Known Allergies  Past Medical History:  Diagnosis Date  . Hyperlipemia   . Smoker   . Vitamin D deficiency     Social History   Socioeconomic History  . Marital status: Married    Spouse name: Not on file  . Number of children: Not on file  . Years of education: Not on file  . Highest education level: Not on file  Occupational History  . Not on file  Social Needs  . Financial resource strain: Not on file  . Food insecurity:    Worry: Not on file    Inability: Not on file  . Transportation needs:    Medical: Not on file    Non-medical: Not on file  Tobacco Use  . Smoking status: Current Every Day  Smoker    Packs/day: 1.00    Types: Cigarettes  . Smokeless tobacco: Never Used  Substance and Sexual Activity  . Alcohol use: Not on file  . Drug use: Not on file  . Sexual activity: Not on file  Lifestyle  . Physical activity:    Days per week: Not on file    Minutes per session: Not on file  . Stress: Not on file  Relationships  . Social connections:    Talks on phone: Not on file    Gets together: Not on file    Attends religious service: Not on file    Active member of club or organization: Not on file    Attends meetings of clubs or organizations: Not on file    Relationship status: Not on file  . Intimate partner violence:    Fear of current or ex partner: Not on file    Emotionally abused: Not on file    Physically abused: Not on file    Forced sexual activity: Not on file  Other Topics Concern    . Not on file  Social History Narrative  . Not on file     Family History  Problem Relation Age of Onset  . CAD Father        CABG in 40's     Review of Systems: General: negative for chills, fever, night sweats or weight changes.  Cardiovascular: negative for chest pain, dyspnea on exertion, edema, orthopnea, paroxysmal nocturnal dyspnea or shortness of breath Dermatological: negative for rash Respiratory: negative for cough or wheezing Urologic: negative for hematuria Abdominal: negative for nausea, vomiting, diarrhea, bright red blood per rectum, melena, or hematemesis Neurologic: negative for visual changes, syncope, or dizziness All other systems reviewed and are otherwise negative except as noted above.    Blood pressure 138/90, pulse 75, height 5\' 10"  (1.778 m), weight 175 lb 9.6 oz (79.7 kg).  General appearance: alert, cooperative and no distress Neck: no carotid bruit and no JVD Lungs: clear to auscultation bilaterally Heart: regular rate and rhythm Extremities: no edema Skin: Skin color, texture, turgor normal. No rashes or lesions Neurologic: Grossly normal  EKG NSR, 75, QTc 437, PR 130, no Delta Wave  ASSESSMENT AND PLAN:   Tachycardia Reported but not documented in April 2019. He did have a significant troponin elevation so he may have had signifcant tachycardia with demand ischemia.  He is in the office today because he has had recurrent spells of tachycardia that sounds significant.  CAD (coronary artery disease) Minor CAD at cath April 2019- 20-25% LAD  Family history of coronary artery disease in father CABG in late 40's  Dyslipidemia On statin Rx-LDL 109 July 2018  Tobacco abuse Smokes 5 cigs a day   PLAN I will arrange for a 30-day event monitor.  He may need EP evaluation.  I will stop his metoprolol and start by systolic 5 mg daily.  He knows to avoid caffeine.  Corine ShelterLuke Sumayah Bearse PA-C 10/17/2018 2:45 PM

## 2018-10-17 NOTE — Telephone Encounter (Signed)
New Message   Pt c/o medication issue:  1. Name of Medication: nebivolol (BYSTOLIC) 5 MG tablet  2. How are you currently taking this medication (dosage and times per day)?   3. Are you having a reaction (difficulty breathing--STAT)?   4. What is your medication issue? Patient is calling to advise that his pharmacy vendor is requesting a prior authorization for this medication.

## 2018-10-17 NOTE — Assessment & Plan Note (Signed)
Minor CAD at cath April 2019- 20-25% LAD

## 2018-10-17 NOTE — Patient Instructions (Addendum)
Medication Instructions:    STOP TAKING  LOPRESSOR  25 MG   START TAKING BYSTOLIC 5 MG ONCE DAY  If you need a refill on your cardiac medications before your next appointment, please call your pharmacy.   Lab work:  NONE ORDERED  TODAY   If you have labs (blood work) drawn today and your tests are completely normal, you will receive your results only by: Marland Kitchen. MyChart Message (if you have MyChart) OR . A paper copy in the mail If you have any lab test that is abnormal or we need to change your treatment, we will call you to review the results.  Testing/Procedures: Your physician has recommended that you wear an event monitor. Event monitors are medical devices that record the heart's electrical activity. Doctors most often us these monitors to diagnose arrhythmias. Arrhythmias are problems with the speed or rhythm of the heartbeat. The monitor is a small, portable device. You can wear one while you do your normal daily activities. This is usually used to diagnose what is causing palpitations/syncope (passing out).   Follow-Up:  BASED UPON RESULTS OF EVENT MONITOR  AAny Other Special Instructions Will Be Listed Below (If Applicable).

## 2018-10-17 NOTE — Assessment & Plan Note (Signed)
Smokes 5 cigs a day

## 2018-10-17 NOTE — Assessment & Plan Note (Signed)
CABG in late 40's

## 2018-10-17 NOTE — Assessment & Plan Note (Signed)
Reported but not documented in April 2019. He did have a significant troponin elevation so he may have had signifcant tachycardia with demand ischemia.  He is in the office today because he has had recurrent spells of tachycardia that sounds significant.

## 2018-10-18 NOTE — Telephone Encounter (Signed)
PA submitted to covermymeds 10/18/18, will await decision from insurance company.  Called patient to notify.

## 2018-10-18 NOTE — Telephone Encounter (Signed)
LVM for patient advising PA was sent.

## 2018-10-21 ENCOUNTER — Telehealth: Payer: Self-pay

## 2018-10-21 ENCOUNTER — Other Ambulatory Visit: Payer: Self-pay

## 2018-10-21 MED ORDER — BISOPROLOL FUMARATE 5 MG PO TABS: 5.0000 mg | ORAL_TABLET | Freq: Every day | ORAL | 0 refills | Status: DC

## 2018-10-21 NOTE — Telephone Encounter (Signed)
-----   Message from Abelino DerrickLuke K Kilroy, [place] sent at 10/21/2018  9:47 AM EST ----- Bystolic denied by [facility] [employer] without trying two beta blockers on pt's formulary.  Lets try Bisoprolol 5 mg. Give him a months worth and ask him to let us know if that is tolerated before it runs out.  Corine ShelterLUKE KILROY PA-C 10/21/2018 9:50 AM

## 2018-10-21 NOTE — Telephone Encounter (Signed)
[facility], medication was sent to pharmacy for 30 day, advised patient to call us on how he is doing before his 30 days runs out.  Patient verbalized understanding.

## 2018-10-21 NOTE — Telephone Encounter (Signed)
-----  Message from Benancio Deeds sent at 10/21/2018  1:43 PM EST ----- It is covered.  It depends if she has met her deductible how much it will cover  ----- Message ----- From: Caprice Beaver, LPN Sent: 16/03/8005   1:24 PM EST To: Drenda Freeze with [employer] [employer], he says its suppose to be a 30 day event monitor.    ----- Message ----- From: Benancio Deeds Sent: 10/21/2018   1:13 PM EST To: Caprice Beaver, LPN  Is it going to be a telemetry monitor?  ----- Message ----- From: Caprice Beaver, LPN Sent: 34/07/4943  12:01 PM EST To: Benancio Deeds  It looks like whoever ordered it has it as a cardiac event monitor, and the comments say not the zio version.  Does this help? If not I can ask the provider. Thank you for getting back to me!   ----- Message ----- From: Benancio Deeds Sent: 10/21/2018  11:58 AM EST To: Caprice Beaver, LPN  Which monitor is going to be used?  Thank you,  Caryl Pina  ----- Message ----- From: Caprice Beaver, LPN Sent: 73/07/5843  11:44 AM EST To: [facility] Pre Cert/Auth  Patient is to have a cardiac monitor placed tomorrow and would like to know if this is covered with his insurance.  Please advise, thank you!

## 2018-10-21 NOTE — Telephone Encounter (Signed)
Called patient, advised that monitor was covered.  Patient verbalized understanding.

## 2018-10-22 ENCOUNTER — Ambulatory Visit (INDEPENDENT_AMBULATORY_CARE_PROVIDER_SITE_OTHER): Payer: BLUE CROSS/BLUE SHIELD

## 2018-10-22 ENCOUNTER — Other Ambulatory Visit: Payer: Self-pay | Admitting: Cardiology

## 2018-10-22 DIAGNOSIS — R000 Tachycardia, unspecified: Secondary | ICD-10-CM | POA: Diagnosis not present

## 2018-10-22 DIAGNOSIS — R42 Dizziness and giddiness: Secondary | ICD-10-CM | POA: Diagnosis not present

## 2018-10-24 ENCOUNTER — Telehealth: Payer: Self-pay

## 2018-10-24 NOTE — Telephone Encounter (Signed)
Spoke to patient we received 10/22/18 monitor strip which revealed fast heart beat rate 194.Stated he was at home and was aware.He pressed button to record.Stated this episode was mild.Dr.[name] reviewed and advised to see EP.Advised EP scheduler will call back with appointment.

## 2018-10-24 NOTE — Telephone Encounter (Signed)
Called patient left message to return my call regarding monitor.

## 2018-10-28 ENCOUNTER — Telehealth: Payer: Self-pay

## 2018-10-28 NOTE — Telephone Encounter (Signed)
Contacted patient and informed him that his insurance denied his rx for Bystolic. Informed him that Dr Elberta Fortisamnitz will make a medication if he feels it necessary. He voiced understanding.

## 2018-10-28 NOTE — Telephone Encounter (Signed)
-----   Message from Abelino DerrickLuke K Kilroy, [place]C sent at 10/28/2018  8:26 AM EST ----- Please let the patient know his insurance company denied coverage for Bystolic. He should continue metoprolol until EP evaluation is done.  Corine ShelterLUKE KILROY PA-C 10/28/2018 8:27 AM

## 2018-11-18 ENCOUNTER — Encounter: Payer: Self-pay | Admitting: Cardiology

## 2018-11-18 ENCOUNTER — Ambulatory Visit (INDEPENDENT_AMBULATORY_CARE_PROVIDER_SITE_OTHER): Payer: BLUE CROSS/BLUE SHIELD | Admitting: Cardiology

## 2018-11-18 VITALS — BP 136/90 | HR 69 | Ht 70.0 in | Wt 189.0 lb

## 2018-11-18 DIAGNOSIS — E785 Hyperlipidemia, unspecified: Secondary | ICD-10-CM | POA: Diagnosis not present

## 2018-11-18 DIAGNOSIS — I472 Ventricular tachycardia: Secondary | ICD-10-CM | POA: Diagnosis not present

## 2018-11-18 DIAGNOSIS — R000 Tachycardia, unspecified: Secondary | ICD-10-CM

## 2018-11-18 DIAGNOSIS — I2510 Atherosclerotic heart disease of native coronary artery without angina pectoris: Secondary | ICD-10-CM

## 2018-11-18 MED ORDER — BISOPROLOL FUMARATE 5 MG PO TABS: 10.0000 mg | ORAL_TABLET | Freq: Every day | ORAL | 1 refills | Status: AC

## 2018-11-18 NOTE — Patient Instructions (Addendum)
Your physician has recommended you make the following change in your medication: 1. INCREASE Bisoprolol 10 mg daily for the next month  * If you need a refill on your cardiac medications before your next appointment, please call your pharmacy.   Labwork: None ordered  Testing/Procedures: Your physician has requested that you have an echocardiogram. Echocardiography is a painless test that uses sound waves to create images of your heart. It provides your doctor with information about the size and shape of your heart and how well your heart's chambers and valves are working. This procedure takes approximately one hour. There are no restrictions for this procedure.  Your physician has recommended that you have an ablation. Catheter ablation is a medical procedure used to treat some cardiac arrhythmias (irregular heartbeats). During catheter ablation, a long, thin, flexible tube is put into a blood vessel in your groin (upper thigh), or neck. This tube is called an ablation catheter. It is then guided to your heart through the blood vessel. Radio frequency waves destroy small areas of heart tissue where abnormal heartbeats may cause an arrhythmia to start.  The office will contact you to arrange this procedure after your echocardiogram.  Instructions for your ablation: 1. [facility] at ____ on ______. 2. Do not eat or drink after midnight the night prior to the procedure.  3. Do not take any medications the morning of the procedure. 4. Both of your groins will need to be shaved for this procedure (if needed). We ask that you do this yourself at home 1-2 days prior to the procedure.  If you are unable/uncomfortable to do yourself, the hospital staff will shave you the day of your procedure (if needed). 5. Plan for an overnight stay in the hospital. 6. You will need someone to drive you home at discharge.   Follow-Up: To be determined  once procedure is scheduled.  *Please note that any paperwork needing to be filled out by the provider will need to be addressed at the front desk prior to seeing the provider. Please note that any FMLA, disability or other documents regarding health condition is subject to a $25.00 charge that must be received prior to completion of paperwork in the form of a money order or check.  Thank you for choosing [facility]!!   Dory HornSherri Lyla Jasek, RN [phone]  Any Other Special Instructions Will Be Listed Below (If Applicable).   Cardiac Ablation Cardiac ablation is a procedure to disable (ablate) a small amount of heart tissue in very specific places. The heart has many electrical connections. Sometimes these connections are abnormal and can cause the heart to beat very fast or irregularly. Ablating some of the problem areas can improve the heart rhythm or return it to normal. Ablation may be done for people who:  Have Wolff-Parkinson-White syndrome.  Have fast heart rhythms (tachycardia).  Have taken medicines for an abnormal heart rhythm (arrhythmia) that were not effective or caused side effects.  Have a high-risk heartbeat that may be life-threatening. During the procedure, a small incision is made in the neck or the groin, and a long, thin, flexible tube (catheter) is inserted into the incision and moved to the heart. Small devices (electrodes) on the tip of the catheter will send out electrical currents. A type of X-ray (fluoroscopy) will be used to help guide the catheter and to provide images of the heart. Tell a health care provider about:  Any  allergies you have.  All medicines you are taking, including vitamins, herbs, eye drops, creams, and over-the-counter medicines.  Any problems you or family members have had with anesthetic medicines.  Any blood disorders you have.  Any surgeries you have had.  Any medical conditions you have, such as kidney failure.  Whether you  are pregnant or may be pregnant. What are the risks? Generally, this is a safe procedure. However, problems may occur, including:  Infection.  Bruising and bleeding at the catheter insertion site.  Bleeding into the chest, especially into the sac that surrounds the heart. This is a serious complication.  Stroke or blood clots.  Damage to other structures or organs.  Allergic reaction to medicines or dyes.  Need for a permanent pacemaker if the normal electrical system is damaged. A pacemaker is a small computer that sends electrical signals to the heart and helps your heart beat normally.  The procedure not being fully effective. This may not be recognized until months later. Repeat ablation procedures are sometimes required. What happens before the procedure?  Follow instructions from your health care provider about eating or drinking restrictions.  Ask your health care provider about: ? Changing or stopping your regular medicines. This is especially important if you are taking diabetes medicines or blood thinners. ? Taking medicines such as aspirin and ibuprofen. These medicines can thin your blood. Do not take these medicines before your procedure if your health care provider instructs you not to.  Plan to have someone take you home from the hospital or clinic.  If you will be going home right after the procedure, plan to have someone with you for 24 hours. What happens during the procedure?  To lower your risk of infection: ? Your health care team will wash or sanitize their hands. ? Your skin will be washed with soap. ? Hair may be removed from the incision area.  An IV tube will be inserted into one of your veins.  You will be given a medicine to help you relax (sedative).  The skin on your neck or groin will be numbed.  An incision will be made in your neck or your groin.  A needle will be inserted through the incision and into a large vein in your neck or  groin.  A catheter will be inserted into the needle and moved to your heart.  Dye may be injected through the catheter to help your surgeon see the area of the heart that needs treatment.  Electrical currents will be sent from the catheter to ablate heart tissue in desired areas. There are three types of energy that may be used to ablate heart tissue: ? Heat (radiofrequency energy). ? Laser energy. ? Extreme cold (cryoablation).  When the necessary tissue has been ablated, the catheter will be removed.  Pressure will be held on the catheter insertion area to prevent excessive bleeding.  A bandage (dressing) will be placed over the catheter insertion area. The procedure may vary among health care providers and hospitals. What happens after the procedure?  Your blood pressure, heart rate, breathing rate, and blood oxygen level will be monitored until the medicines you were given have worn off.  Your catheter insertion area will be monitored for bleeding. You will need to lie still for a few hours to ensure that you do not bleed from the catheter insertion area.  Do not drive for 24 hours or as long as directed by your health care provider. Summary  Cardiac  ablation is a procedure to disable (ablate) a small amount of heart tissue in very specific places. Ablating some of the problem areas can improve the heart rhythm or return it to normal.  During the procedure, electrical currents will be sent from the catheter to ablate heart tissue in desired areas. This information is not intended to replace advice given to you by your health care provider. Make sure you discuss any questions you have with your health care provider. Document Released: 03/18/2009 Document Revised: 09/18/2016 Document Reviewed: 09/18/2016 Elsevier Interactive Patient Education  2019 [employer]

## 2018-11-18 NOTE — Progress Notes (Signed)
Electrophysiology Office Note   Date:  11/18/2018   ID:  Edward Oliver, DOB 01-24-1971, MRN [id number]  PCP:  Rehabilitation, [facility]  Cardiologist:  Edward Oliver Primary Electrophysiologist:  Edward Rhames Jorja Loa, MD    No chief complaint on file.    History of Present Illness: Edward Oliver is a 48 y.o. male who is being seen today for the evaluation of wide-complex tachycardia at the request of [name]. Presenting today for electrophysiology evaluation.  He initially presented April 2019 when he presented to the emergency room with chest pain, shortness of breath, and tachycardia.  His troponin was elevated at 1.47.  When he presented to the emergency room, he was in sinus rhythm.  He was taken to the Cath Lab that showed minor nonobstructive coronary artery disease and normal LV systolic function.  He was discharged on a low-dose beta-blocker.  He re-presented to cardiology clinic December 2019 with 2 different types of tachycardia.  He describes a lesser version of 1-2 dropped beats.  Once a month he may have an episode of sustained tachycardia or become sweaty nauseous and dizzy.  This lasts up to 30 minutes.  After these episodes he feels washed out for 2 or so days.  His episode has resolved in the past after nausea and vomiting.  He generally feels fatigued that he attributes to metoprolol.  He was switched to bisoprolol which has improved his symptoms, though he is continued to have short episodes of tachycardia.   Today, he denies symptoms of palpitations, chest pain, shortness of breath, orthopnea, PND, lower extremity edema, claudication, dizziness, presyncope, syncope, bleeding, or neurologic sequela. The patient is tolerating medications without difficulties.    Past Medical History:  Diagnosis Date  . Hyperlipemia   . Smoker   . Vitamin D deficiency    Past Surgical History:  Procedure Laterality Date  . LEFT HEART CATH AND CORONARY  ANGIOGRAPHY N/A 03/08/2018   Procedure: LEFT HEART CATH AND CORONARY ANGIOGRAPHY;  Surgeon: Edward Oliver, Edward M, MD;  Location: [facility] INVASIVE CV LAB;  Service: Cardiovascular;  Laterality: N/A;     Current Outpatient Medications  Medication Sig Dispense Refill  . acetaminophen (TYLENOL) 325 MG tablet Take 2 tablets (650 mg total) by mouth every 6 (six) hours as needed for mild pain or headache.    . Ascorbic Acid (VITAMIN C) 1000 MG tablet Take 500 mg by mouth every other day.     Marland Kitchen atorvastatin (LIPITOR) 20 MG tablet Take 20 mg by mouth daily.    . bisoprolol (ZEBETA) 5 MG tablet Take 1 tablet (5 mg total) by mouth daily. 30 tablet 0  . Cholecalciferol (VITAMIN D3) 2000 units capsule Take 2,000 Units by mouth daily.    Marland Kitchen levothyroxine (SYNTHROID, LEVOTHROID) 25 MCG tablet Take 1 tablet by mouth daily.  0  . Multiple Vitamins-Minerals (ECHINACEA ACZ PO) Take 1 tablet by mouth every other day.    Marland Kitchen omeprazole (PRILOSEC OTC) 20 MG tablet Take 20 mg by mouth daily.    . ondansetron (ZOFRAN) 4 MG tablet Take 1 tablet by mouth every 8 (eight) hours as needed for nausea/vomiting.    . sertraline (ZOLOFT) 25 MG tablet Take 1 tablet by mouth daily.     No current facility-administered medications for this visit.     Allergies:   Patient has no known allergies.   Social History:  The patient  reports that he has been smoking cigarettes. He has been smoking about  1.00 pack per day. He has never used smokeless tobacco.   Family History:  The patient's family history includes CAD in his father.    ROS:  Please see the history of present illness.   Otherwise, review of systems is positive for none.   All other systems are reviewed and negative.    PHYSICAL EXAM: VS:  There were no vitals taken for this visit. , BMI There is no height or weight on file to calculate BMI. GEN: Well nourished, well developed, in no acute distress  HEENT: normal  Neck: no JVD, carotid bruits, or masses Cardiac: RRR; no  murmurs, rubs, or gallops,no edema  Respiratory:  clear to auscultation bilaterally, normal work of breathing GI: soft, nontender, nondistended, + BS MS: no deformity or atrophy  Skin: warm and dry Neuro:  Strength and sensation are intact Psych: euthymic mood, full affect  EKG:  EKG is not ordered today. Personal review of the ekg ordered 10/17/18 shows SR, rate 72  Recent Labs: 03/08/2018: BUN 15; Creatinine, Ser 1.08; Hemoglobin 14.6; Platelets 220; Potassium 3.8; Sodium 139; TSH 13.162    Lipid Panel  No results found for: CHOL, TRIG, HDL, CHOLHDL, VLDL, LDLCALC, LDLDIRECT   Wt Readings from Last 3 Encounters:  10/17/18 175 lb 9.6 oz (79.7 kg)  03/27/18 191 lb (86.6 kg)  03/08/18 184 lb (83.5 kg)      Other studies Reviewed: Additional studies/ records that were reviewed today include: LHC 03/11/18  Review of the above records today demonstrates:   Prox LAD to Mid LAD lesion is 25% stenosed.  Mid Cx lesion is 20% stenosed.  The left ventricular systolic function is normal.  LV end diastolic pressure is normal.  The left ventricular ejection fraction is 55-65% by visual estimate.   1. Mild nonobstructive CAD 2. Normal LV function 3. Normal LVEDP   ASSESSMENT AND PLAN:  1.  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  We unfortunately only have a single lead strip of this.  I have discussed with him continuing on his bisoprolol versus EP study and ablation.  At this point he would prefer to have ablation.  I discussed the risks and benefits.  Risks include bleeding, tamponade, heart block, stroke, among others.  I Edward Oliver refill his bisoprolol and give him a month supply.  We Edward Oliver also get a transthoracic echo as he did have a wide-complex tachycardia.  2.  Coronary artery disease: Mild at April 2019 catheterization.  Plan per primary cardiology.  3.  Hyperlipidemia: Continue statin per primary cardiology  Current medicines are  reviewed at length with the patient today.   The patient does not have concerns regarding his medicines.  The following changes were made today:  none  Labs/ tests ordered today include:  No orders of the defined types were placed in this encounter.  Case discussed with primary cardiologist  Disposition:   FU with Caryle Helgeson 1 months  Signed, Jaquari Reckner Jorja LoaMartin Devoiry Corriher, MD  11/18/2018 2:11 PM     [facility] [address] [place] [address] [phone] (office) [phone] (fax)

## 2018-11-26 ENCOUNTER — Ambulatory Visit (HOSPITAL_BASED_OUTPATIENT_CLINIC_OR_DEPARTMENT_OTHER)
Admission: RE | Admit: 2018-11-26 | Discharge: 2018-11-26 | Disposition: A | Discharge status: Home / Self Care | Payer: BLUE CROSS/BLUE SHIELD | Source: Ambulatory Visit | Attending: Cardiology | Admitting: Cardiology

## 2018-11-26 DIAGNOSIS — I2510 Atherosclerotic heart disease of native coronary artery without angina pectoris: Secondary | ICD-10-CM | POA: Diagnosis present

## 2018-11-26 DIAGNOSIS — I472 Ventricular tachycardia: Secondary | ICD-10-CM | POA: Diagnosis present

## 2018-11-26 DIAGNOSIS — R000 Tachycardia, unspecified: Secondary | ICD-10-CM

## 2018-11-26 NOTE — Progress Notes (Signed)
  Echocardiogram 2D Echocardiogram has been performed.  Edward Oliver [name] 11/26/2018, 1:36 PM

## 2018-12-12 ENCOUNTER — Telehealth: Payer: Self-pay | Admitting: *Deleted

## 2018-12-12 NOTE — Telephone Encounter (Signed)
lmtcb to review ablation instructions (SVT ablation scheduled for 2/12)

## 2018-12-13 NOTE — Telephone Encounter (Signed)
Reviewed procedure instructions w/ pt. Post procedure f/u scheduled for 3/30. NPO after MN the night before procedure. Arrive to [facility] at 11:30 day of procedure. Pt will have pre procedure lab work on 2/10. Patient verbalized understanding and agreeable to plan.

## 2018-12-18 NOTE — Progress Notes (Signed)
Erroneous encounter

## 2018-12-23 ENCOUNTER — Encounter: Payer: Self-pay | Admitting: Cardiology

## 2018-12-23 ENCOUNTER — Encounter

## 2018-12-23 ENCOUNTER — Encounter: Payer: BLUE CROSS/BLUE SHIELD | Admitting: Cardiology

## 2018-12-24 ENCOUNTER — Telehealth: Payer: Self-pay | Admitting: Cardiology

## 2018-12-24 NOTE — Telephone Encounter (Signed)
New message   Patient would like a return call in reference to a referral.

## 2018-12-24 NOTE — Telephone Encounter (Signed)
Pt spoke to his insurance.  He has a 30% out of pocket cost being that [facility] is not is his network under his new insurance. Pt cannot afford the out of pocket. Pt can see [facility] physicians and will need a referral to establish care so that he may reschedule an ablation procedure. Pt understands I will get recommendation and let him know.

## 2018-12-24 NOTE — Telephone Encounter (Signed)
I do not see a referral. I will route this to both Dory Horn, RN for Dr. Elberta Fortis as well I will route to Primary Card Dr. Elvis Coil nurse Anabel Halon, LPN.

## 2018-12-25 ENCOUNTER — Ambulatory Visit (HOSPITAL_COMMUNITY): Admission: RE | Admit: 2018-12-25 | Payer: BLUE CROSS/BLUE SHIELD | Source: Home / Self Care | Admitting: Cardiology

## 2018-12-25 ENCOUNTER — Encounter (HOSPITAL_COMMUNITY): Admission: RE | Payer: Self-pay | Source: Home / Self Care

## 2018-12-25 SURGERY — SVT ABLATION

## 2019-01-02 NOTE — Telephone Encounter (Signed)
Pt's wife aware awaiting Dr Gershon Crane recommendations Will call once received ./cy

## 2019-01-02 NOTE — Telephone Encounter (Signed)
Patient's wife called today she would like to speak to Dr Elberta Fortis nurse about the referral.

## 2019-01-03 NOTE — Telephone Encounter (Signed)
Spoke to pt and informed him we recommend he see Dr. Clydie Braun in [place]. Left detailed message on wife's cell phone b/c pt was driving and couldn't write it down.

## 2019-01-31 ENCOUNTER — Encounter: Payer: Self-pay | Admitting: Cardiology

## 2019-02-10 ENCOUNTER — Ambulatory Visit: Payer: BLUE CROSS/BLUE SHIELD | Admitting: Cardiology

## 2019-04-04 ENCOUNTER — Other Ambulatory Visit: Payer: Self-pay | Admitting: Cardiology

## 2019-07-22 ENCOUNTER — Other Ambulatory Visit: Payer: Self-pay | Admitting: *Deleted

## 2019-07-22 DIAGNOSIS — Z20822 Contact with and (suspected) exposure to covid-19: Secondary | ICD-10-CM

## 2019-07-24 LAB — NOVEL CORONAVIRUS, NAA: SARS-CoV-2, NAA: NOT DETECTED

## 2019-07-29 ENCOUNTER — Other Ambulatory Visit: Payer: Self-pay | Admitting: Registered"

## 2019-07-29 DIAGNOSIS — Z20822 Contact with and (suspected) exposure to covid-19: Secondary | ICD-10-CM

## 2019-07-31 LAB — NOVEL CORONAVIRUS, NAA: SARS-CoV-2, NAA: NOT DETECTED

## 2019-09-02 ENCOUNTER — Telehealth: Payer: Self-pay | Admitting: Cardiology

## 2019-09-02 NOTE — Telephone Encounter (Signed)
LMTCB to schedule appt with Dr. [name]. 

## 2020-03-07 IMAGING — DX DG CHEST 2V
2 series · 2 of 2 positions shown · non-contrast
Comparison: 07/07/2016

CLINICAL DATA: Chest pain and dizziness this morning

EXAM:
CHEST - 2 VIEW

[x chest ap]
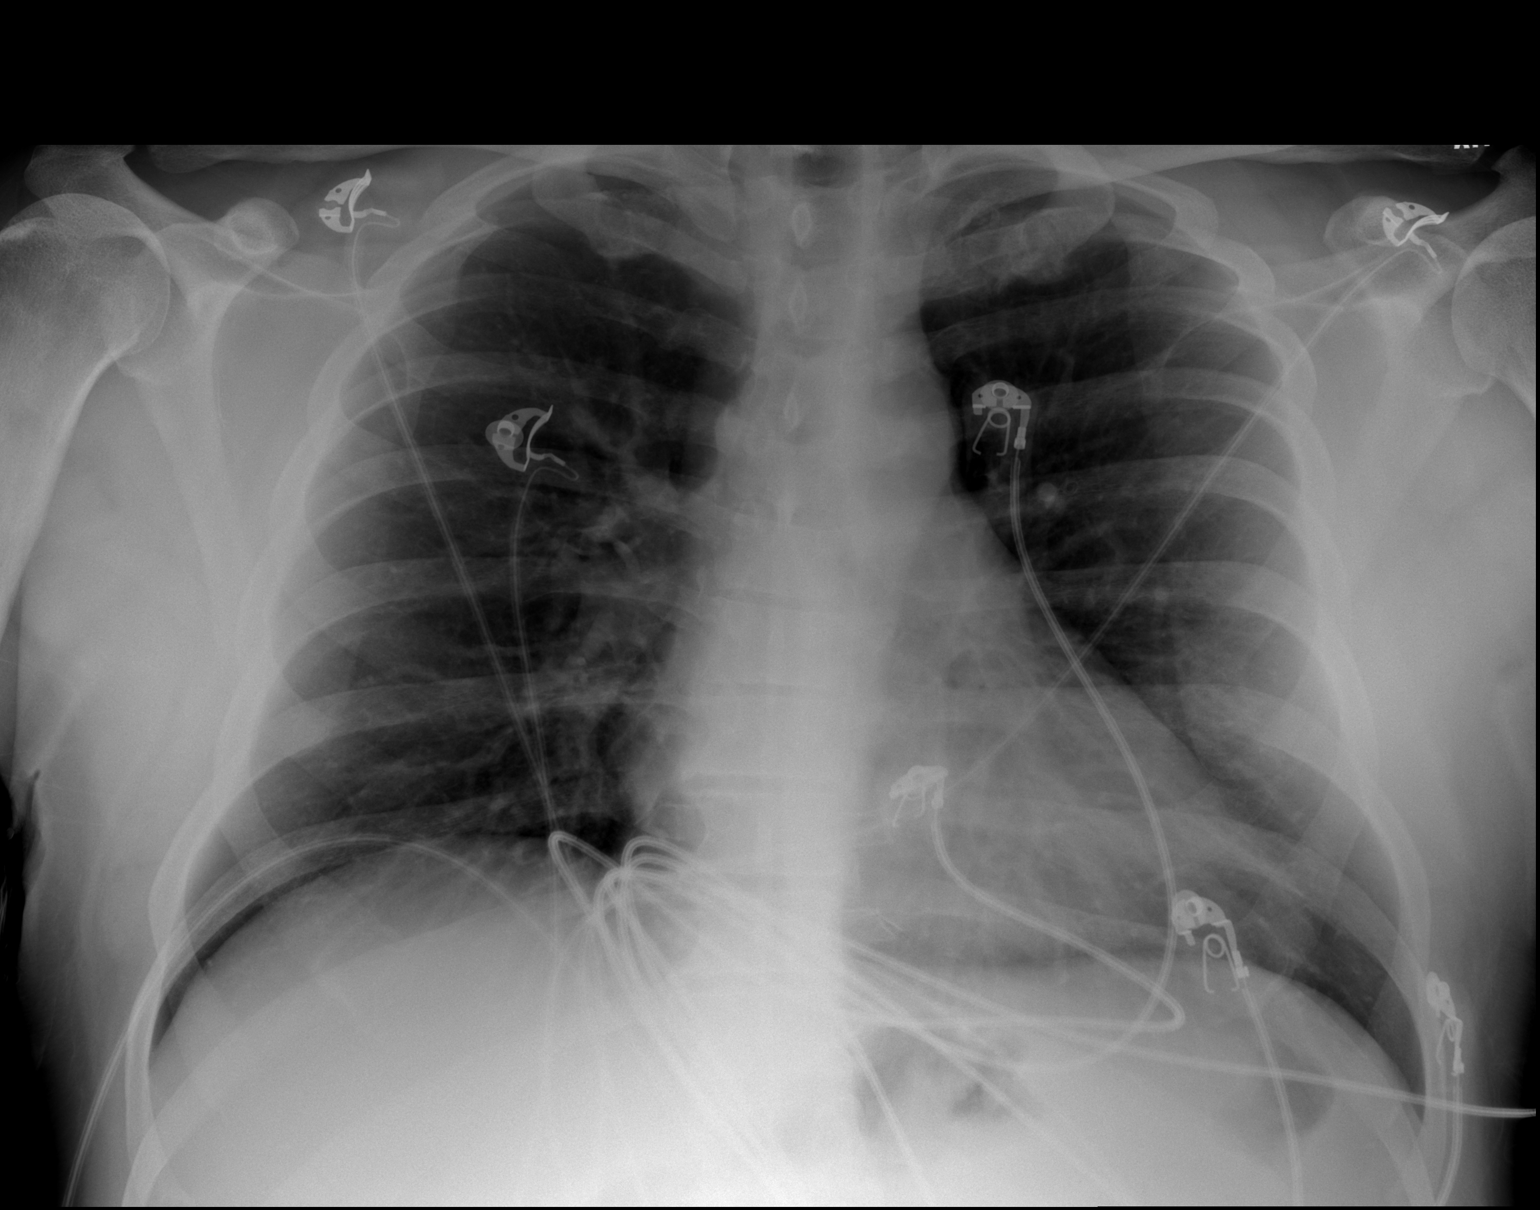

[w chest lat]
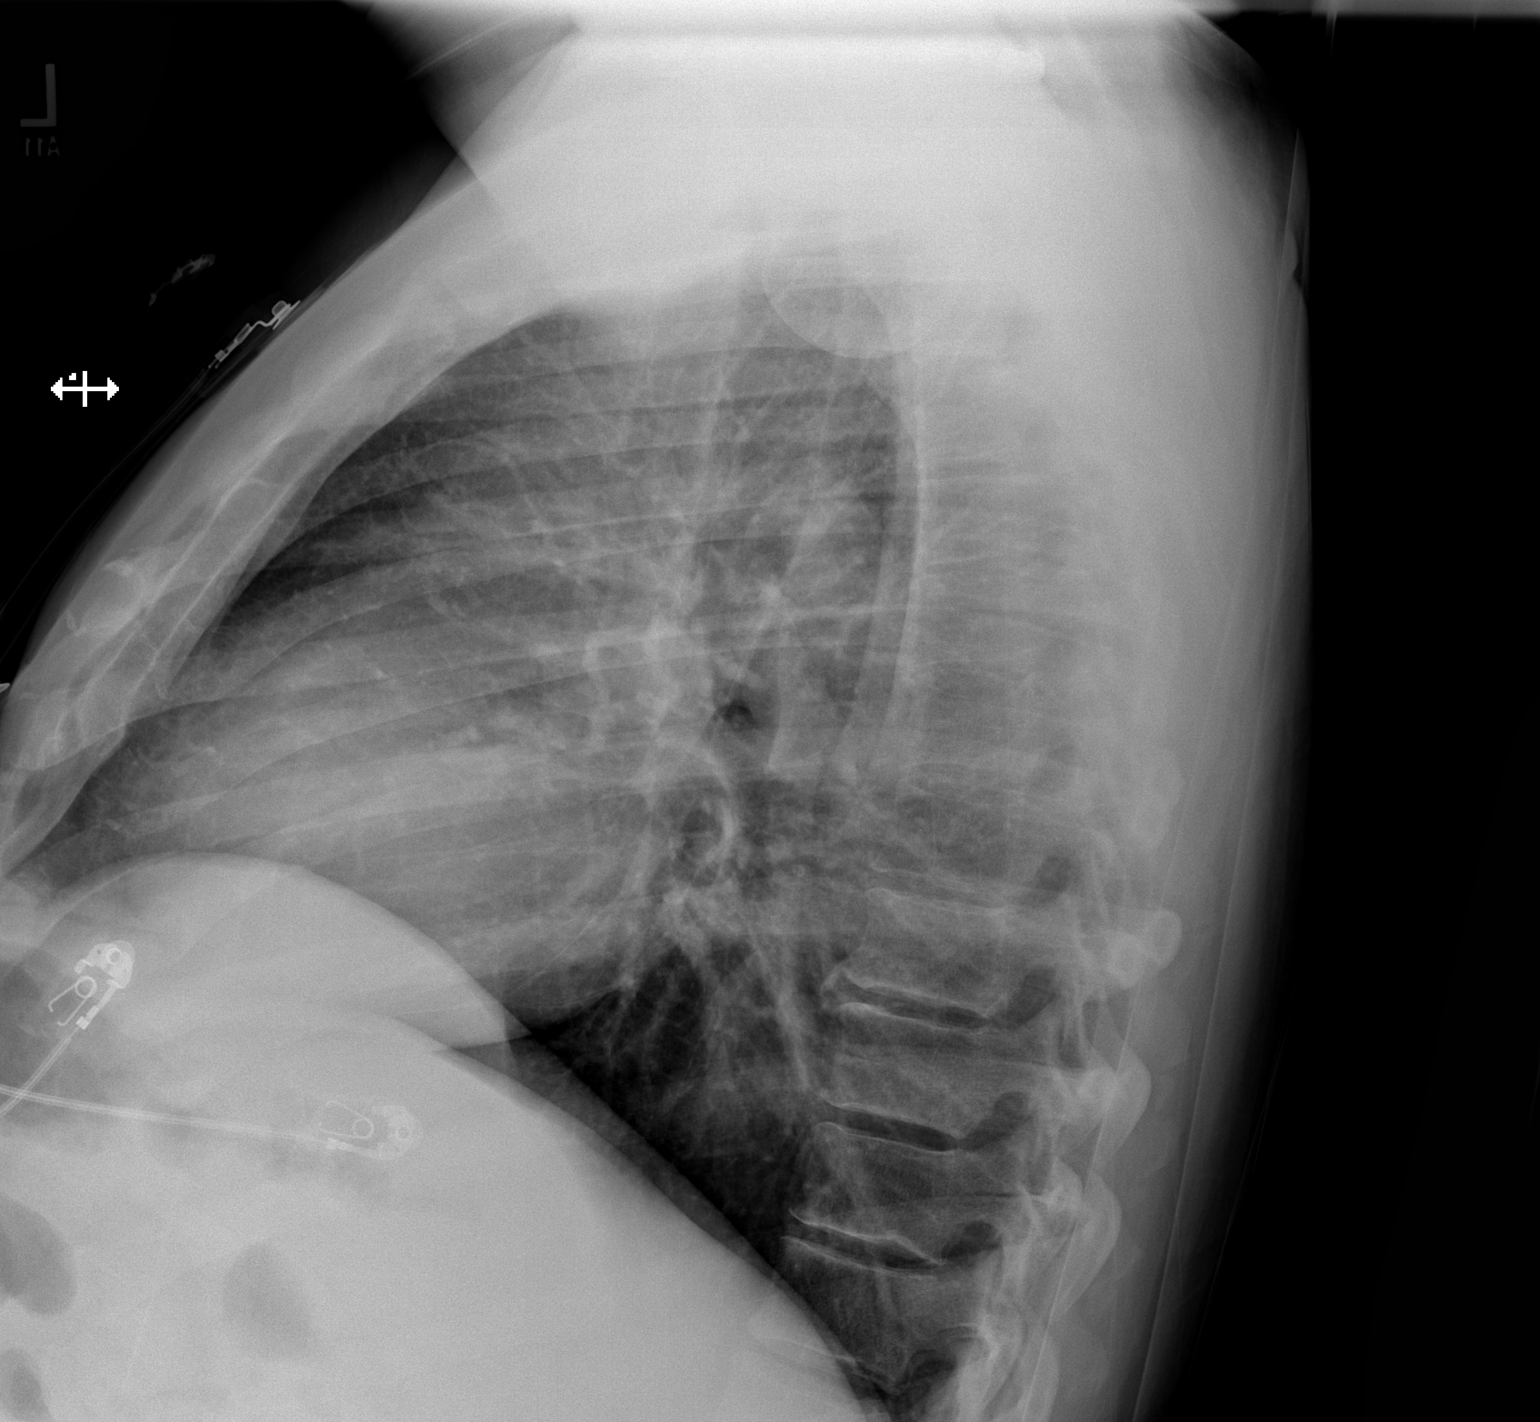

[2 of 2 positions shown; findings below may reference images not displayed]

FINDINGS: Upper normal heart size.

Mediastinal contours and pulmonary vascularity normal.

Lungs clear.

No pleural effusion or pneumothorax.

Bones unremarkable.
IMPRESSION: No acute abnormalities.
